# Patient Record
Sex: Female | Born: 1955 | Hispanic: No | Marital: Married | Smoking: Never smoker
Health system: Southern US, Community
[De-identification: ages and names within clinical notes are randomized; demographics above are authoritative.]

## PROBLEM LIST (undated history)

## (undated) DIAGNOSIS — I252 Old myocardial infarction: Secondary | ICD-10-CM

## (undated) HISTORY — DX: Old myocardial infarction: I25.2

## (undated) HISTORY — PX: OTHER SURGICAL HISTORY: SHX169

---

## 2014-08-01 ENCOUNTER — Ambulatory Visit (INDEPENDENT_AMBULATORY_CARE_PROVIDER_SITE_OTHER): Payer: PRIVATE HEALTH INSURANCE | Admitting: Family Medicine

## 2014-08-01 VITALS — BP 120/80 | HR 88 | Temp 99.4°F | Resp 18 | Ht 59.25 in | Wt 173.4 lb

## 2014-08-01 DIAGNOSIS — E860 Dehydration: Secondary | ICD-10-CM

## 2014-08-01 NOTE — Patient Instructions (Signed)
I believe the dry mouth, lightheadedness, and even muscle twitching in the chest a caused by the diaphoretic torsemide (Dytor).  By stopping this diaphoretic, patient's potassium should increase in her lightheadedness should resolve without taking other medication. Otherwise I would keep her on all of her other medicines and expect resolution the next 48 hours.

## 2014-08-01 NOTE — Progress Notes (Signed)
Patient from [place] who does not speaking which is brought in by her son in a company by her husband and granddaughter. She is from [place], [place].  Patient has a hypertension and cardiac history. She's been taking torsemide and has developed a dry mouth after taking it every morning along with some muscle twitching and dizziness. She's never had much in the way of edema.  Patient has no crushing chest pain. Instead she has some left upper chest sharp pains.  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 I really think that if we just stop the torsemide her symptoms will resolve.  Plan: Stop the diaphoretic and expect resolution in 48 hours to 72 hours. If symptoms persist, return and we will evaluate further  Signed, Sheila Oats.D.

## 2014-09-12 ENCOUNTER — Ambulatory Visit (INDEPENDENT_AMBULATORY_CARE_PROVIDER_SITE_OTHER): Payer: PRIVATE HEALTH INSURANCE | Admitting: Emergency Medicine

## 2014-09-12 VITALS — BP 120/78 | HR 78 | Temp 99.1°F | Resp 18 | Ht 59.0 in | Wt 187.0 lb

## 2014-09-12 DIAGNOSIS — I209 Angina pectoris, unspecified: Secondary | ICD-10-CM

## 2014-09-12 DIAGNOSIS — R0602 Shortness of breath: Secondary | ICD-10-CM | POA: Diagnosis not present

## 2014-09-12 NOTE — Progress Notes (Signed)
Subjective:  Patient ID: Katie Spears, female    DOB: [birth year]  Age: 59 y.o. MRN: [id number]  CC: Chest Pain   HPI Aryn Safran presents   With chest pain and increased shortness of breath with exertion. She is visiting from [place] and has had a two-vessel stent while there has a history of hypertension and hyperlipidemia. She has no history of diabetes or smoking. Today she had an episode of chest pain that was pressure like lasting about 30 minutes after she ate. She denies any nausea vomiting or indigestion. Denies any stool change. Has no cough shortness breath or wheezing. Pain was nonradiating. And it passed with rest. She describes increasing shortness of breath with exertion. She has no orthopnea or paroxysmal nocturnal dyspnea. No peripheral edema. She has no doctor locally. She's been pain-free since the episodes have been  History Muzio has a past medical history of History of heart attack.   She has past surgical history that includes heart stints.   Her  family history is not on file.  She   reports that she has never smoked. She does not have any smokeless tobacco history on file. She reports that she does not drink alcohol or use illicit drugs.  Outpatient Prescriptions Prior to Visit  Medication Sig Dispense Refill  . aspirin 75 MG chewable tablet Chew 75 mg by mouth daily.    Marland Kitchen atorvastatin (LIPITOR) 40 MG tablet Take 40 mg by mouth daily.    . RABEprazole (ACIPHEX) 20 MG tablet Take 20 mg by mouth daily.    . ticagrelor (BRILINTA) 90 MG TABS tablet Take 90 mg by mouth 2 (two) times daily.    Marland Kitchen torsemide (DEMADEX) 5 MG tablet Take 2.5 mg by mouth daily.    . valsartan (DIOVAN) 80 MG tablet Take 80 mg by mouth daily.     No facility-administered medications prior to visit.    History   Social History  . Marital Status: Married    Spouse Name: N/A  . Number of Children: N/A  . Years of Education: N/A   Social History Main Topics  . Smoking status:  Never Smoker   . Smokeless tobacco: Not on file  . Alcohol Use: No  . Drug Use: No  . Sexual Activity: Not on file   Other Topics Concern  . None   Social History Narrative     Review of Systems  Constitutional: Negative for fever, chills and appetite change.  HENT: Negative for congestion, ear pain, postnasal drip, sinus pressure and sore throat.   Eyes: Negative for pain and redness.  Respiratory: Negative for cough, shortness of breath and wheezing.   Cardiovascular: Negative for leg swelling.  Gastrointestinal: Negative for nausea, vomiting, abdominal pain, diarrhea, constipation and blood in stool.  Endocrine: Negative for polyuria.  Genitourinary: Negative for dysuria, urgency, frequency and flank pain.  Musculoskeletal: Negative for gait problem.  Skin: Negative for rash.  Neurological: Negative for weakness and headaches.  Psychiatric/Behavioral: Negative for confusion and decreased concentration. The patient is not nervous/anxious.     Objective:  BP 120/78 mmHg  Pulse 78  Temp(Src) 99.1 F (37.3 C) (Oral)  Resp 18  Ht 4\' 11"  (1.499 m)  Wt 187 lb (84.823 kg)  BMI 37.75 kg/m2  SpO2 97%  Physical Exam  Constitutional: She is oriented to person, place, and time. She appears well-developed and well-nourished. No distress.  HENT:  Head: Normocephalic and atraumatic.  Right Ear: External ear normal.  Left Ear: External  ear normal.  Nose: Nose normal.  Eyes: Conjunctivae and EOM are normal. Pupils are equal, round, and reactive to light. No scleral icterus.  Neck: Normal range of motion. Neck supple. No tracheal deviation present.  Cardiovascular: Normal rate, regular rhythm and normal heart sounds.   Pulmonary/Chest: Effort normal. No respiratory distress. She has no wheezes. She has no rales.  Abdominal: She exhibits no mass. There is no tenderness. There is no rebound and no guarding.  Musculoskeletal: She exhibits no edema.  Lymphadenopathy:    She has no  cervical adenopathy.  Neurological: She is alert and oriented to person, place, and time. Coordination normal.  Skin: Skin is warm and dry. No rash noted.  Psychiatric: She has a normal mood and affect. Her behavior is normal.      Assessment & Plan:   Dona was seen today for chest pain.  Diagnoses and all orders for this visit:  Ischemic chest pain Orders: -     EKG 12-Lead  Exertional shortness of breath   I am having Ms. Ardel maintain her aspirin, valsartan, ticagrelor, atorvastatin, torsemide, and RABEprazole.  No orders of the defined types were placed in this encounter.    I strongly advised her to go to the emergency room by ambulance and she refused. Son-in-law was translating for her. Her husband also spoke [place]. I explained the risks ever having an undiagnosed and untreated recurrent heart attack and she understood the risks and still elected to go home. Within understanding we'll arrange a consultation tomorrow with when the cardiologist.  if she should have recurrence of pain or shortness breath she is to call 911 and she agreed. Appropriate red flag conditions were discussed with the patient as well as actions that should be taken.  Patient expressed his understanding.  Follow-up: No Follow-up on file.  Carmelina Dane, MD

## 2014-09-12 NOTE — Patient Instructions (Signed)

## 2014-09-13 ENCOUNTER — Telehealth: Payer: Self-pay

## 2014-09-13 NOTE — Telephone Encounter (Signed)
Patient has been scheduled at [facility] [phone]) on 09/17/14 at 1 pm (arrive at 12:30 pm) with Marcy Salvo. Dr Jacinto Halim will be assisting with Bridgette also. Bridgette Revonda Standard is requesting patient to have labs done (Troponin,BMP,CBC, and CMP) and a chest xray to make sure patient does not have pheunomia. She is wanting this done at our office prior to her appointment on Monday. Per their office patients ekg looks normal but feels she needs the labs/xray to better evaluate patient on Monday.

## 2014-09-13 NOTE — Telephone Encounter (Signed)
She'll have to come have those done.

## 2014-09-14 ENCOUNTER — Ambulatory Visit (INDEPENDENT_AMBULATORY_CARE_PROVIDER_SITE_OTHER): Payer: PRIVATE HEALTH INSURANCE

## 2014-09-14 ENCOUNTER — Ambulatory Visit (INDEPENDENT_AMBULATORY_CARE_PROVIDER_SITE_OTHER): Payer: PRIVATE HEALTH INSURANCE | Admitting: Internal Medicine

## 2014-09-14 ENCOUNTER — Telehealth: Payer: Self-pay

## 2014-09-14 VITALS — BP 138/77 | HR 72 | Temp 97.7°F | Resp 18 | Ht 59.0 in | Wt 187.0 lb

## 2014-09-14 DIAGNOSIS — R0602 Shortness of breath: Secondary | ICD-10-CM | POA: Diagnosis not present

## 2014-09-14 DIAGNOSIS — R0789 Other chest pain: Secondary | ICD-10-CM

## 2014-09-14 LAB — TROPONIN I: TROPONIN I: 0.01 ng/mL (ref <0.06)

## 2014-09-14 NOTE — Telephone Encounter (Signed)
Spoke with son in law because she cannot speak [place]. Advised him to bring pt here, he states he will bring her today.

## 2014-09-14 NOTE — Progress Notes (Signed)
   Subjective:    Patient ID: Katie Spears, female    DOB: Oct 29, 1955, 59 y.o.   MRN: [id number] This chart was scribed for Ellamae Sia, MD by Jolene Provost, Medical Scribe. This patient was seen in Room 10 and the patient's care was started a 6:25 PM.  Chief Complaint  Patient presents with  . CXR    From telephone message pt was told by Dr. Jacinto Halim to have labs and CXR done before Monday visit Troponin,BMP,CBC, and CMP  Son says travel insurance poor and won't cover av OV for labs  HPI  Pt does not speak english. Son translating HPI Comments: Katie Spears is a 59 y.o. female who presents to [facility] reporting for labs and a chest x-ray. The pt was sent for lab testing by Dr. Jacinto Halim after Dr Ewell Poe referral to him.. Pt denies worsening sx. Pt is not in any pain.   Note 08/01/2014 office visit when diuretic was discontinued  Review of Systems     Objective:   Physical Exam  Constitutional: She appears well-developed and well-nourished. No distress.  Cardiovascular: Normal rate, regular rhythm and normal heart sounds.   I don't appreciate a gallop. She has no peripheral edema but good peripheral pulses.  Pulmonary/Chest: Effort normal. No respiratory distress.  Crackles at both bases  Skin: She is not diaphoretic.  Nursing note and vitals reviewed. BP 138/77 mmHg  Pulse 72  Temp(Src) 97.7 F (36.5 C) (Oral)  Resp 18  Ht  (1.499 m)  Wt 187 lb (84.823 kg)  BMI 37.75 kg/m2  SpO2 97% [facility] reading (PRIMARY) by  Dr. Merla Riches fluffy infiltrates bilaterally       Assessment & Plan:  Other chest pain - Plan: Basic metabolic panel, CBC with Differential/Platelet, DG Chest 2 View, Troponin I, CANCELED: Troponin I  Shortness of breath - Plan: Basic metabolic panel, CBC with Differential/Platelet, Brain natriuretic peptide, Troponin I  Labs drawn for dr Jacinto Halim- cardiologist Advised to restart Demadex which was stopped 5-6 weeks ago by Dr. Milus Glazier out of concern  for dehydration but was never restarted.  I have completed the patient encounter in its entirety as documented by the scribe, with editing by me where necessary. Varie Machamer P. Merla Riches, M.D.

## 2014-09-14 NOTE — Telephone Encounter (Signed)
Patient name was reversed in the system.   Please resubmit the insurance claim on her behalf.

## 2014-09-14 NOTE — Patient Instructions (Signed)
restart demadex

## 2014-09-15 LAB — BASIC METABOLIC PANEL
BUN: 11 mg/dL (ref 7–25)
CO2: 26 mmol/L (ref 20–31)
CREATININE: 0.57 mg/dL (ref 0.50–1.05)
Calcium: 9.3 mg/dL (ref 8.6–10.4)
Chloride: 104 mmol/L (ref 98–110)
Glucose, Bld: 95 mg/dL (ref 65–99)
POTASSIUM: 4.5 mmol/L (ref 3.5–5.3)
SODIUM: 139 mmol/L (ref 135–146)

## 2014-09-18 ENCOUNTER — Encounter: Payer: Self-pay | Admitting: Family Medicine

## 2014-09-19 LAB — CBC WITH DIFFERENTIAL/PLATELET

## 2014-09-24 LAB — BRAIN NATRIURETIC PEPTIDE

## 2014-09-25 ENCOUNTER — Other Ambulatory Visit: Payer: PRIVATE HEALTH INSURANCE

## 2014-09-25 ENCOUNTER — Telehealth: Payer: Self-pay

## 2014-09-25 NOTE — Telephone Encounter (Addendum)
Patient came in with her son to redo lab work from 09/14/14. Patient was called by angie to go to the appointment center but they came to the walk in. After I checked patient in the son stated he didn't feel the lab work was necessary. He stated she saw Dr Jacinto Halim and feels his mom is now ok. Patient's son is concerned his mother will be charged by [employer] for labs not done. I advised patient to contact Denny Peon in our labs department if he had any questions. Patients call back number is [phone]

## 2014-09-26 NOTE — Telephone Encounter (Signed)
FYI

## 2017-03-04 IMAGING — CR DG CHEST 2V
2 series · 2 of 2 positions shown · non-contrast
Comparison: None.

CLINICAL DATA: Chest pain, shortness of Breath

EXAM:
CHEST  2 VIEW

[lateral]
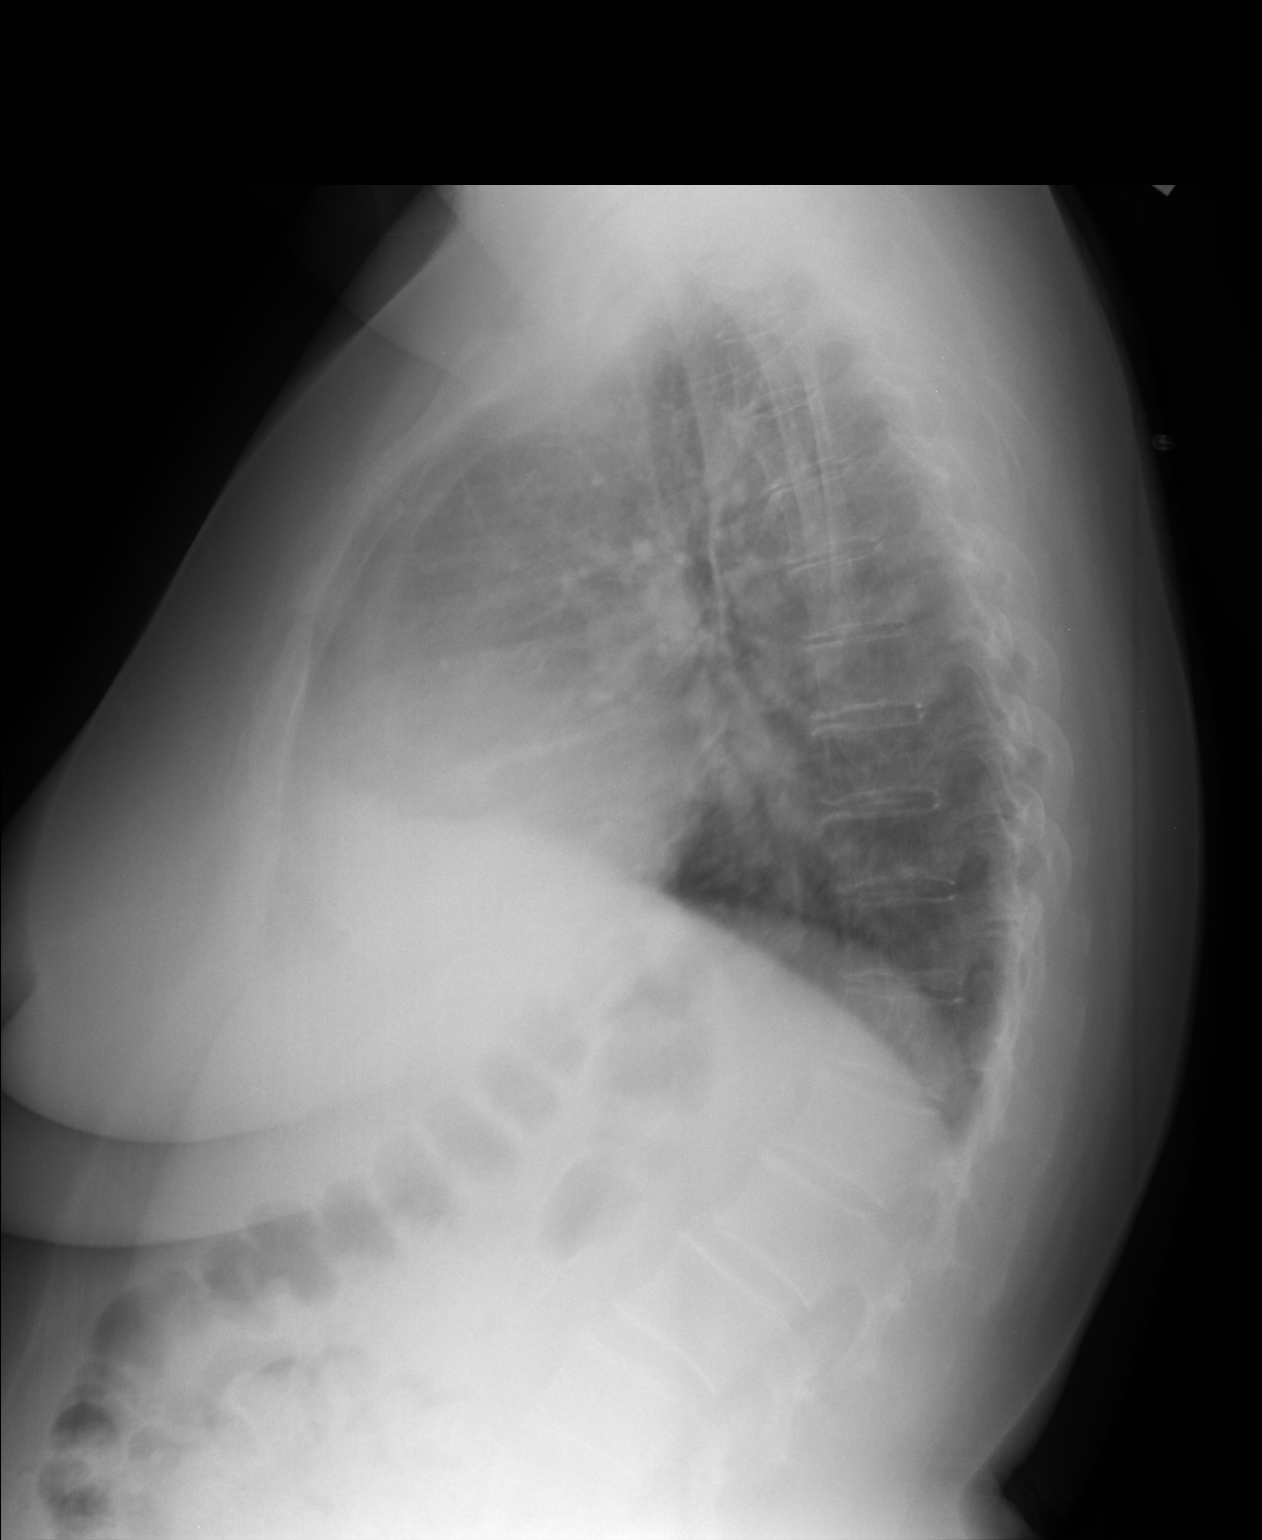

[PA]
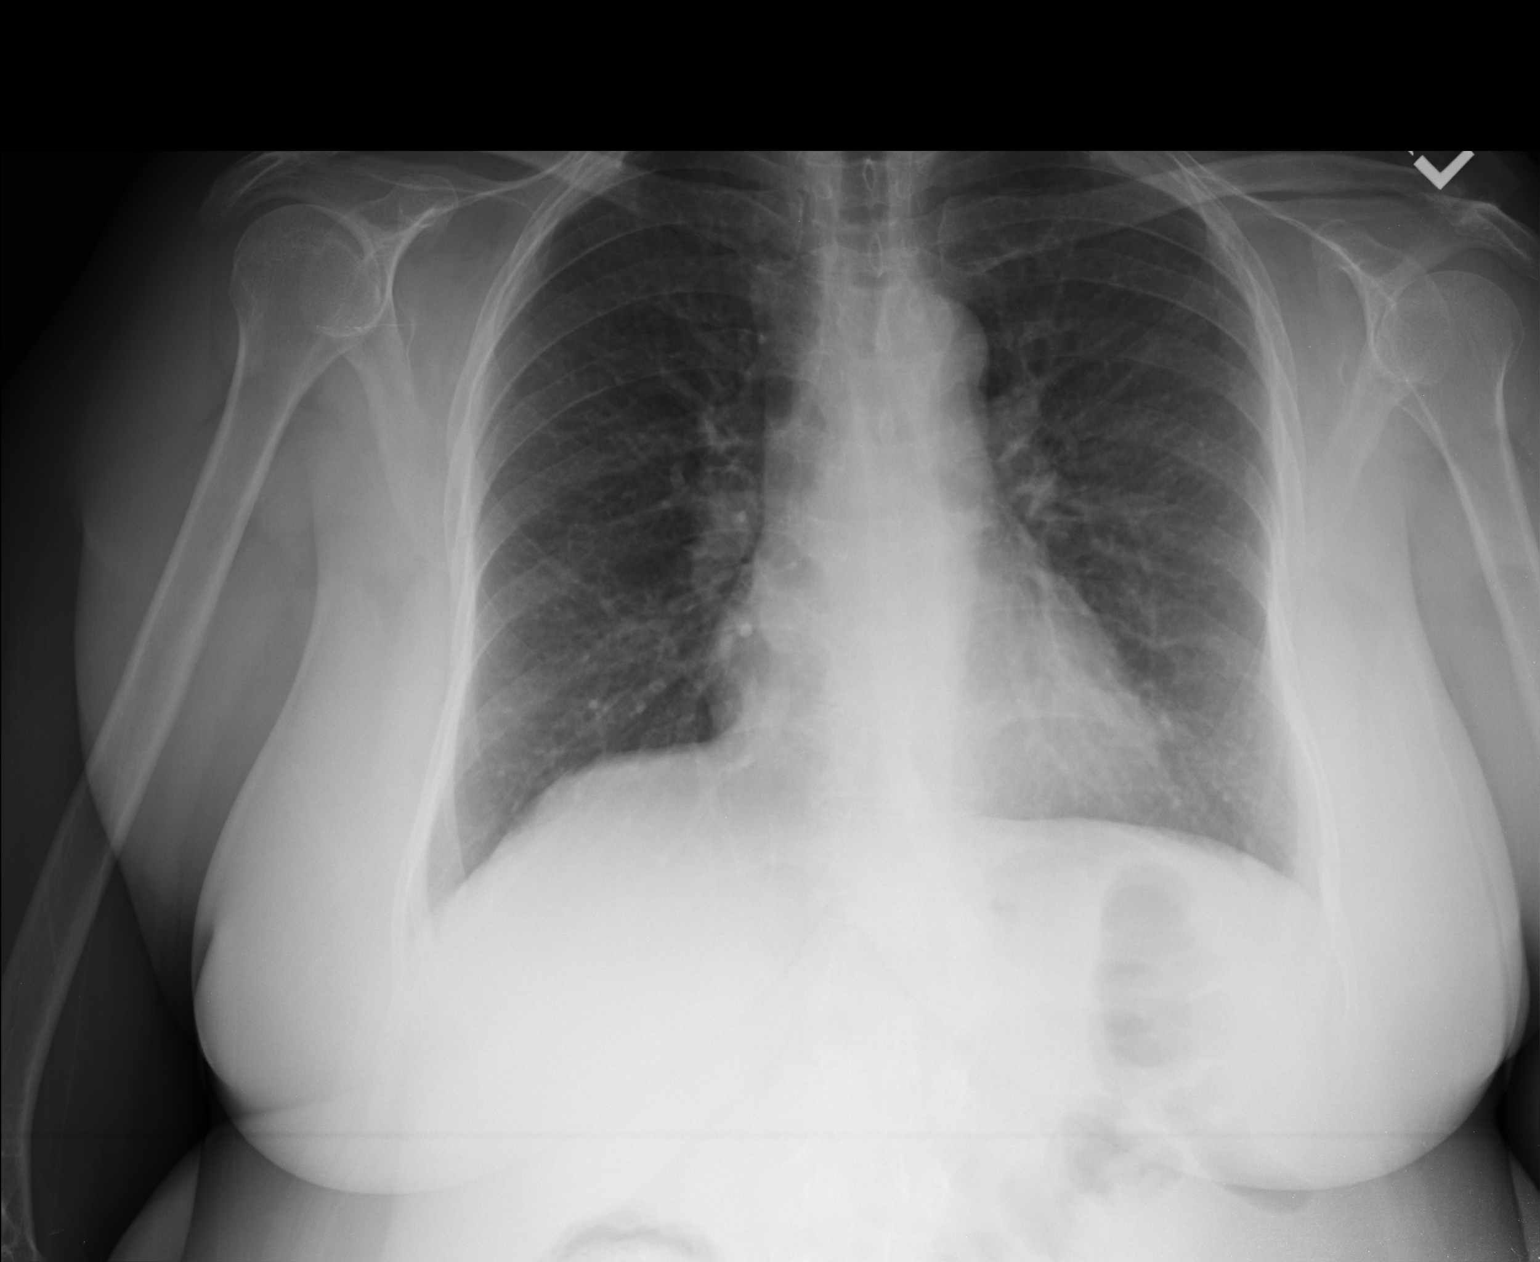

[2 of 2 positions shown; findings below may reference images not displayed]

FINDINGS: Heart and mediastinal contours are within normal limits. Mild
interstitial prominence within the lungs bilaterally without
confluent airspace opacity or effusion. No acute bony abnormality.
IMPRESSION: Mild interstitial prominence in the lungs bilaterally. This could
reflect changes of bronchitis or atypical infection such is viral/
mycoplasma.
